# Patient Record
Sex: Male | Born: 1965
Health system: Southern US, Community
[De-identification: ages and names within clinical notes are randomized; demographics above are authoritative.]

## PROBLEM LIST (undated history)

## (undated) DIAGNOSIS — E7800 Pure hypercholesterolemia, unspecified: Secondary | ICD-10-CM

## (undated) HISTORY — PX: OTHER SURGICAL HISTORY: SHX169

---

## 2018-02-18 NOTE — Progress Notes (Signed)
   [facility] Phone: [phone]   Date of Visit: 02/19/2018   HPI: Patient elected to have his daughter interpret during the visit.   Here is establish care. Patient last saw a doctor in 2017 when he was in [place].  He and his family in 2017.  PMH: Bilateral Thumb joint pain. It is controlled with Advil or Diclofenac which he takes about once a week. Tobacco Use 0.5ppd since 02/2017  Surgical History: removal of benign cyst on the jaw in 1997  Family History updated in chart.   Medication: updated in chart  Concerns: none Exercise: does not exercise regularly Diet: balanced  Denies any drug use. Tobacco use as above. Alcohol: 1 drink per day.  PHQ2 negative   ROS:  Review of Systems  Constitutional: Negative for chills, fever and weight loss.  HENT: Negative for congestion, hearing loss, sore throat and tinnitus.   Eyes: Negative for blurred vision, double vision and pain.  Respiratory: Negative for cough and shortness of breath.   Cardiovascular: Negative for chest pain, palpitations and leg swelling.  Gastrointestinal: Negative for abdominal pain, constipation, diarrhea, nausea and vomiting.  Genitourinary: Negative for frequency and hematuria.  Musculoskeletal: Positive for joint pain. Negative for falls.  Skin: Negative for rash.  Neurological: Negative for dizziness, sensory change, focal weakness and headaches.  Psychiatric/Behavioral: Negative for depression and substance abuse.      PHYSICAL EXAM: BP 100/60 (BP Location: Left Arm, Patient Position: Sitting, Cuff Size: Normal)   Pulse (!) 56   Temp 97.8 F (36.6 C) (Oral)   Wt 135 lb 6.4 oz (61.4 kg)   SpO2 100%  GEN: NAD HEENT: Atraumatic, normocephalic, neck supple, EOMI, sclera clear  CV: RRR, no murmurs, rubs, or gallops PULM: CTAB, normal effort ABD: Soft, nontender, nondistended, NABS, no organomegaly SKIN: No rash or cyanosis; warm and well-perfused EXTR: No lower  extremity edema or calf tenderness PSYCH: Mood and affect euthymic, normal rate and volume of speech NEURO: Awake, alert, no focal deficits grossly, normal speech   ASSESSMENT/PLAN:  Health maintenance:  - Flu vaccine today - Tdap vaccine today - declined colonoscopy - Hep C screening  - HIV screening  Sedentary lifestyle:  No regular exercise. FH of diabetes and HTN. Will obtain basic las. CBC, BMP  Follow up in 1 month. Come in fasting to obtain lipid panel  Palma HolterKanishka G Illias Pantano, MD PGY 3 [facility]

## 2018-02-19 ENCOUNTER — Ambulatory Visit: Payer: BLUE CROSS/BLUE SHIELD | Admitting: Internal Medicine

## 2018-02-19 ENCOUNTER — Encounter: Payer: Self-pay | Admitting: Internal Medicine

## 2018-02-19 VITALS — BP 100/60 | HR 56 | Temp 97.8°F | Wt 135.4 lb

## 2018-02-19 DIAGNOSIS — Z23 Encounter for immunization: Secondary | ICD-10-CM

## 2018-02-19 DIAGNOSIS — Z0001 Encounter for general adult medical examination with abnormal findings: Secondary | ICD-10-CM

## 2018-02-19 DIAGNOSIS — Z0000 Encounter for general adult medical examination without abnormal findings: Secondary | ICD-10-CM

## 2018-02-19 DIAGNOSIS — Z114 Encounter for screening for human immunodeficiency virus [HIV]: Secondary | ICD-10-CM

## 2018-02-19 DIAGNOSIS — Z1159 Encounter for screening for other viral diseases: Secondary | ICD-10-CM

## 2018-02-19 DIAGNOSIS — F17200 Nicotine dependence, unspecified, uncomplicated: Secondary | ICD-10-CM

## 2018-02-19 DIAGNOSIS — Z7689 Persons encountering health services in other specified circumstances: Secondary | ICD-10-CM

## 2018-02-19 NOTE — Patient Instructions (Addendum)
Please follow up in 1 month for cholesterol check

## 2018-02-20 ENCOUNTER — Encounter: Payer: Self-pay | Admitting: Internal Medicine

## 2018-02-20 LAB — BASIC METABOLIC PANEL
BUN/Creatinine Ratio: 16 (ref 9–20)
BUN: 11 mg/dL (ref 6–24)
CALCIUM: 9.8 mg/dL (ref 8.7–10.2)
CO2: 22 mmol/L (ref 20–29)
CREATININE: 0.7 mg/dL — AB (ref 0.76–1.27)
Chloride: 103 mmol/L (ref 96–106)
GFR calc Af Amer: 125 mL/min/{1.73_m2} (ref >59)
GFR, EST NON AFRICAN AMERICAN: 108 mL/min/{1.73_m2} (ref >59)
GLUCOSE: 85 mg/dL (ref 65–99)
POTASSIUM: 4.7 mmol/L (ref 3.5–5.2)
Sodium: 143 mmol/L (ref 134–144)

## 2018-02-20 LAB — CBC
HEMATOCRIT: 44.8 % (ref 37.5–51.0)
Hemoglobin: 15.9 g/dL (ref 13.0–17.7)
MCH: 30.1 pg (ref 26.6–33.0)
MCHC: 35.5 g/dL (ref 31.5–35.7)
MCV: 85 fL (ref 79–97)
PLATELETS: 246 10*3/uL (ref 150–379)
RBC: 5.28 x10E6/uL (ref 4.14–5.80)
RDW: 12.9 % (ref 12.3–15.4)
WBC: 8.3 10*3/uL (ref 3.4–10.8)

## 2018-02-20 LAB — HIV ANTIBODY (ROUTINE TESTING W REFLEX): HIV SCREEN 4TH GENERATION: NONREACTIVE

## 2018-02-20 LAB — HEPATITIS C ANTIBODY: Hep C Virus Ab: <0.1 s/co ratio (ref 0.0–0.9)

## 2018-02-20 NOTE — Progress Notes (Signed)
Sent letter with normal lab results.

## 2019-05-14 ENCOUNTER — Emergency Department (HOSPITAL_COMMUNITY): Payer: No Typology Code available for payment source

## 2019-05-14 ENCOUNTER — Emergency Department (HOSPITAL_COMMUNITY)
Admission: EM | Admit: 2019-05-14 | Discharge: 2019-05-14 | Disposition: A | Discharge status: Home / Self Care | Payer: No Typology Code available for payment source | Attending: Emergency Medicine | Admitting: Emergency Medicine

## 2019-05-14 ENCOUNTER — Other Ambulatory Visit: Payer: Self-pay

## 2019-05-14 ENCOUNTER — Encounter (HOSPITAL_COMMUNITY): Payer: Self-pay

## 2019-05-14 DIAGNOSIS — Y999 Unspecified external cause status: Secondary | ICD-10-CM | POA: Insufficient documentation

## 2019-05-14 DIAGNOSIS — S8011XA Contusion of right lower leg, initial encounter: Secondary | ICD-10-CM | POA: Diagnosis not present

## 2019-05-14 DIAGNOSIS — Z79899 Other long term (current) drug therapy: Secondary | ICD-10-CM | POA: Insufficient documentation

## 2019-05-14 DIAGNOSIS — Y9389 Activity, other specified: Secondary | ICD-10-CM | POA: Insufficient documentation

## 2019-05-14 DIAGNOSIS — F17210 Nicotine dependence, cigarettes, uncomplicated: Secondary | ICD-10-CM | POA: Diagnosis not present

## 2019-05-14 DIAGNOSIS — Y92410 Unspecified street and highway as the place of occurrence of the external cause: Secondary | ICD-10-CM | POA: Insufficient documentation

## 2019-05-14 DIAGNOSIS — S8991XA Unspecified injury of right lower leg, initial encounter: Secondary | ICD-10-CM | POA: Diagnosis present

## 2019-05-14 HISTORY — DX: Pure hypercholesterolemia, unspecified: E78.00

## 2019-05-14 MED ORDER — IBUPROFEN 600 MG PO TABS: 600.0000 mg | ORAL_TABLET | Freq: Four times a day (QID) | ORAL | 0 refills | Status: AC | PRN

## 2019-05-14 MED ORDER — IBUPROFEN 400 MG PO TABS
400.0000 mg | ORAL_TABLET | Freq: Once | ORAL | Status: AC
  Administered 2019-05-14: 400 mg via ORAL
  Filled 2019-05-14: qty 1

## 2019-05-14 MED ORDER — CYCLOBENZAPRINE HCL 5 MG PO TABS: 5.0000 mg | ORAL_TABLET | Freq: Two times a day (BID) | ORAL | 0 refills | Status: AC | PRN
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
